# Patient Record
Sex: Male | Born: 1935
Health system: Southern US, Community
[De-identification: ages and names within clinical notes are randomized; demographics above are authoritative.]

---

## 2005-11-13 ENCOUNTER — Emergency Department: Payer: Self-pay | Admitting: Emergency Medicine

## 2006-09-26 ENCOUNTER — Ambulatory Visit: Payer: Self-pay | Admitting: Ophthalmology

## 2006-10-02 ENCOUNTER — Ambulatory Visit: Payer: Self-pay | Admitting: Ophthalmology

## 2006-11-01 IMAGING — CR DG CHEST 2V
1 series · 2 of 2 positions shown · non-contrast
Comparison: none

REASON FOR EXAM: WEAKNESS   RM 16
COMMENTS:

[Series 1: view not recorded · 0.17mm/px · 2 of 2 slices shown]
[im 1/2]
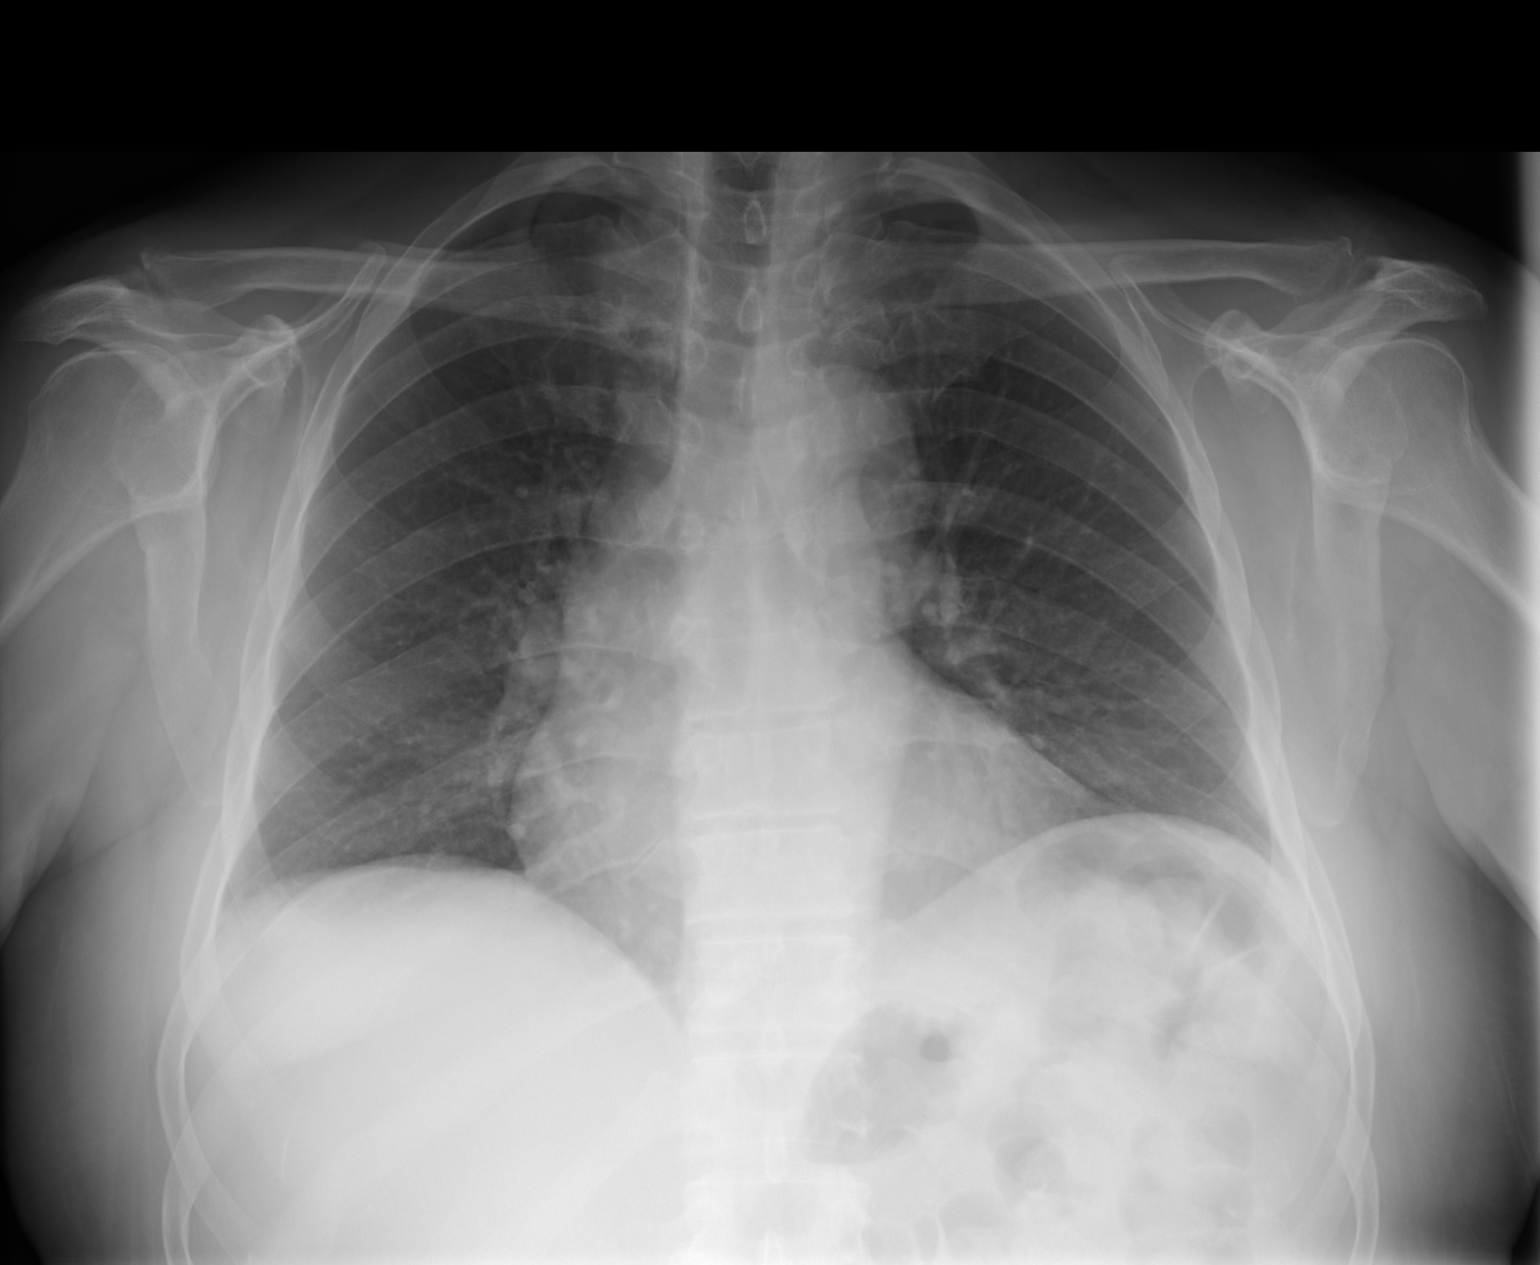
[im 2/2]
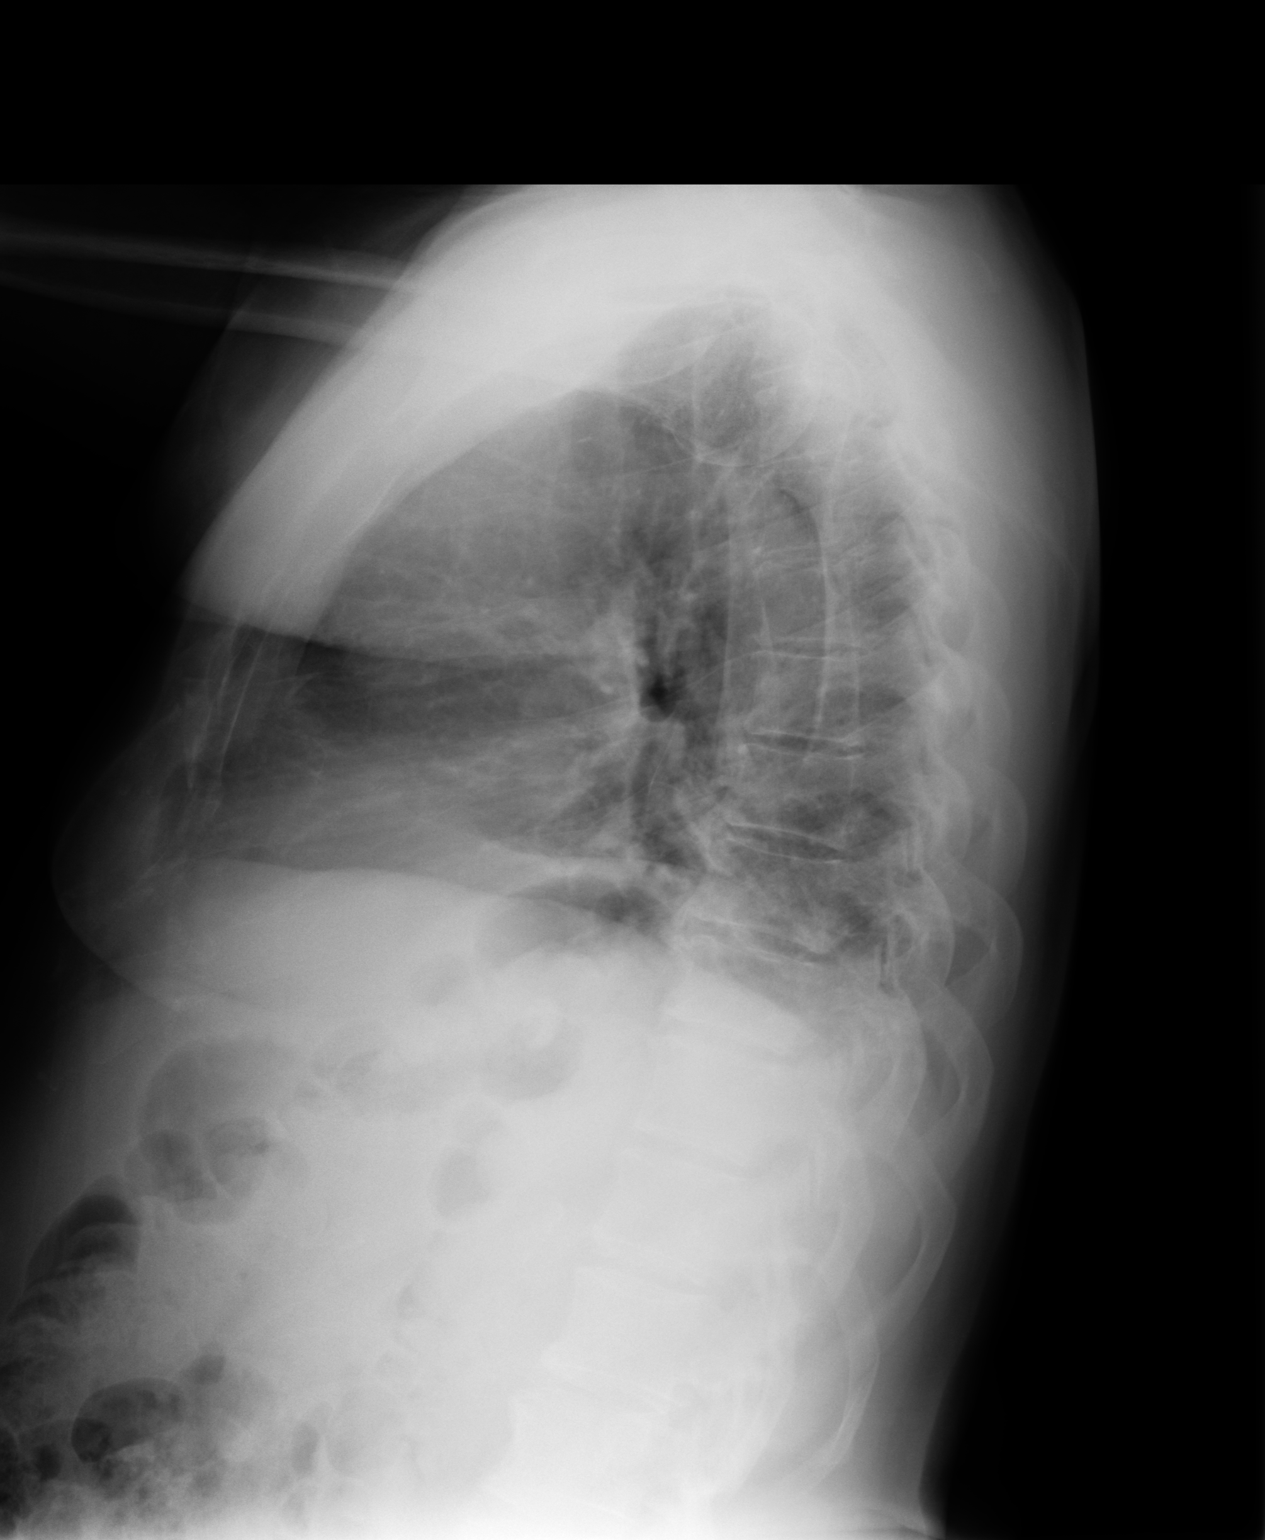

[2 of 2 positions shown; findings below may reference images not displayed]

PROCEDURE:     DXR - DXR CHEST PA (OR AP) AND LATERAL  - November 13, 2005 [DATE]

RESULT:     The lungs are adequately inflated on the frontal view. On the
lateral film there is evidence of some hypoinflation and possible minimal
basilar atelectasis.  The heart is not enlarged and the pulmonary
vascularity is not engorged.  There is mild tortuosity of the descending
thoracic aorta.
IMPRESSION: 1)I do not see objective evidence of acute cardiopulmonary disease.

## 2006-12-11 ENCOUNTER — Ambulatory Visit: Payer: Self-pay | Admitting: Ophthalmology

## 2006-12-18 ENCOUNTER — Ambulatory Visit: Payer: Self-pay | Admitting: Ophthalmology

## 2007-05-15 ENCOUNTER — Ambulatory Visit: Payer: Self-pay | Admitting: Gastroenterology

## 2011-07-03 ENCOUNTER — Ambulatory Visit: Payer: Self-pay | Admitting: Gastroenterology

## 2011-08-03 ENCOUNTER — Ambulatory Visit: Payer: Self-pay | Admitting: Gastroenterology

## 2011-08-07 LAB — PATHOLOGY REPORT

## 2011-09-11 ENCOUNTER — Ambulatory Visit: Payer: Self-pay | Admitting: Gastroenterology

## 2013-04-06 ENCOUNTER — Ambulatory Visit: Payer: Self-pay | Admitting: General Practice

## 2013-04-06 LAB — PROTIME-INR
INR: 1
Prothrombin Time: 13.7 secs (ref 11.5–14.7)

## 2013-04-06 LAB — CBC
HCT: 41 % (ref 40.0–52.0)
HGB: 14 g/dL (ref 13.0–18.0)
MCHC: 34.1 g/dL (ref 32.0–36.0)
MCV: 88 fL (ref 80–100)
Platelet: 199 10*3/uL (ref 150–440)
RBC: 4.65 10*6/uL (ref 4.40–5.90)
RDW: 14.4 % (ref 11.5–14.5)
WBC: 7 10*3/uL (ref 3.8–10.6)

## 2013-04-06 LAB — URINALYSIS, COMPLETE
Bacteria: NONE SEEN
Bilirubin,UR: NEGATIVE
Blood: NEGATIVE
Ketone: NEGATIVE
Leukocyte Esterase: NEGATIVE
Nitrite: NEGATIVE
Protein: NEGATIVE
RBC,UR: 1 /HPF (ref 0–5)
Specific Gravity: 1.015 (ref 1.003–1.030)
Squamous Epithelial: NONE SEEN
WBC UR: 1 /HPF (ref 0–5)

## 2013-04-06 LAB — APTT: Activated PTT: 33.9 secs (ref 23.6–35.9)

## 2013-04-06 LAB — SEDIMENTATION RATE: Erythrocyte Sed Rate: 9 mm/hr (ref 0–20)

## 2013-04-06 LAB — BASIC METABOLIC PANEL
Co2: 28 mmol/L (ref 21–32)
Creatinine: 1.38 mg/dL — ABNORMAL HIGH (ref 0.60–1.30)
EGFR (Non-African Amer.): 48 — ABNORMAL LOW
Glucose: 94 mg/dL (ref 65–99)
Potassium: 4.4 mmol/L (ref 3.5–5.1)
Sodium: 138 mmol/L (ref 136–145)

## 2013-04-06 LAB — MRSA PCR SCREENING

## 2013-04-07 LAB — URINE CULTURE

## 2013-04-20 ENCOUNTER — Inpatient Hospital Stay: Payer: Self-pay | Admitting: General Practice

## 2013-04-21 LAB — BASIC METABOLIC PANEL
Co2: 26 mmol/L (ref 21–32)
Creatinine: 1.09 mg/dL (ref 0.60–1.30)
Glucose: 99 mg/dL (ref 65–99)
Osmolality: 276 (ref 275–301)
Potassium: 4.3 mmol/L (ref 3.5–5.1)
Sodium: 137 mmol/L (ref 136–145)

## 2013-04-21 LAB — HEMOGLOBIN: HGB: 12 g/dL — ABNORMAL LOW (ref 13.0–18.0)

## 2013-04-22 LAB — BASIC METABOLIC PANEL
Anion Gap: 5 — ABNORMAL LOW (ref 7–16)
BUN: 14 mg/dL (ref 7–18)
Chloride: 107 mmol/L (ref 98–107)
Co2: 25 mmol/L (ref 21–32)
EGFR (African American): >60
EGFR (Non-African Amer.): >60
Glucose: 107 mg/dL — ABNORMAL HIGH (ref 65–99)
Osmolality: 275 (ref 275–301)

## 2013-04-23 ENCOUNTER — Encounter: Payer: Self-pay | Admitting: Internal Medicine

## 2014-04-08 IMAGING — CR DG KNEE 1-2V*R*
1 series · 2 of 2 positions shown · non-contrast
Comparison: none

REASON FOR EXAM: postop
COMMENTS:   Bedside (portable):Y

[Series 1: ap · 0.17mm/px · 2 of 2 slices shown]
[im 1/2]
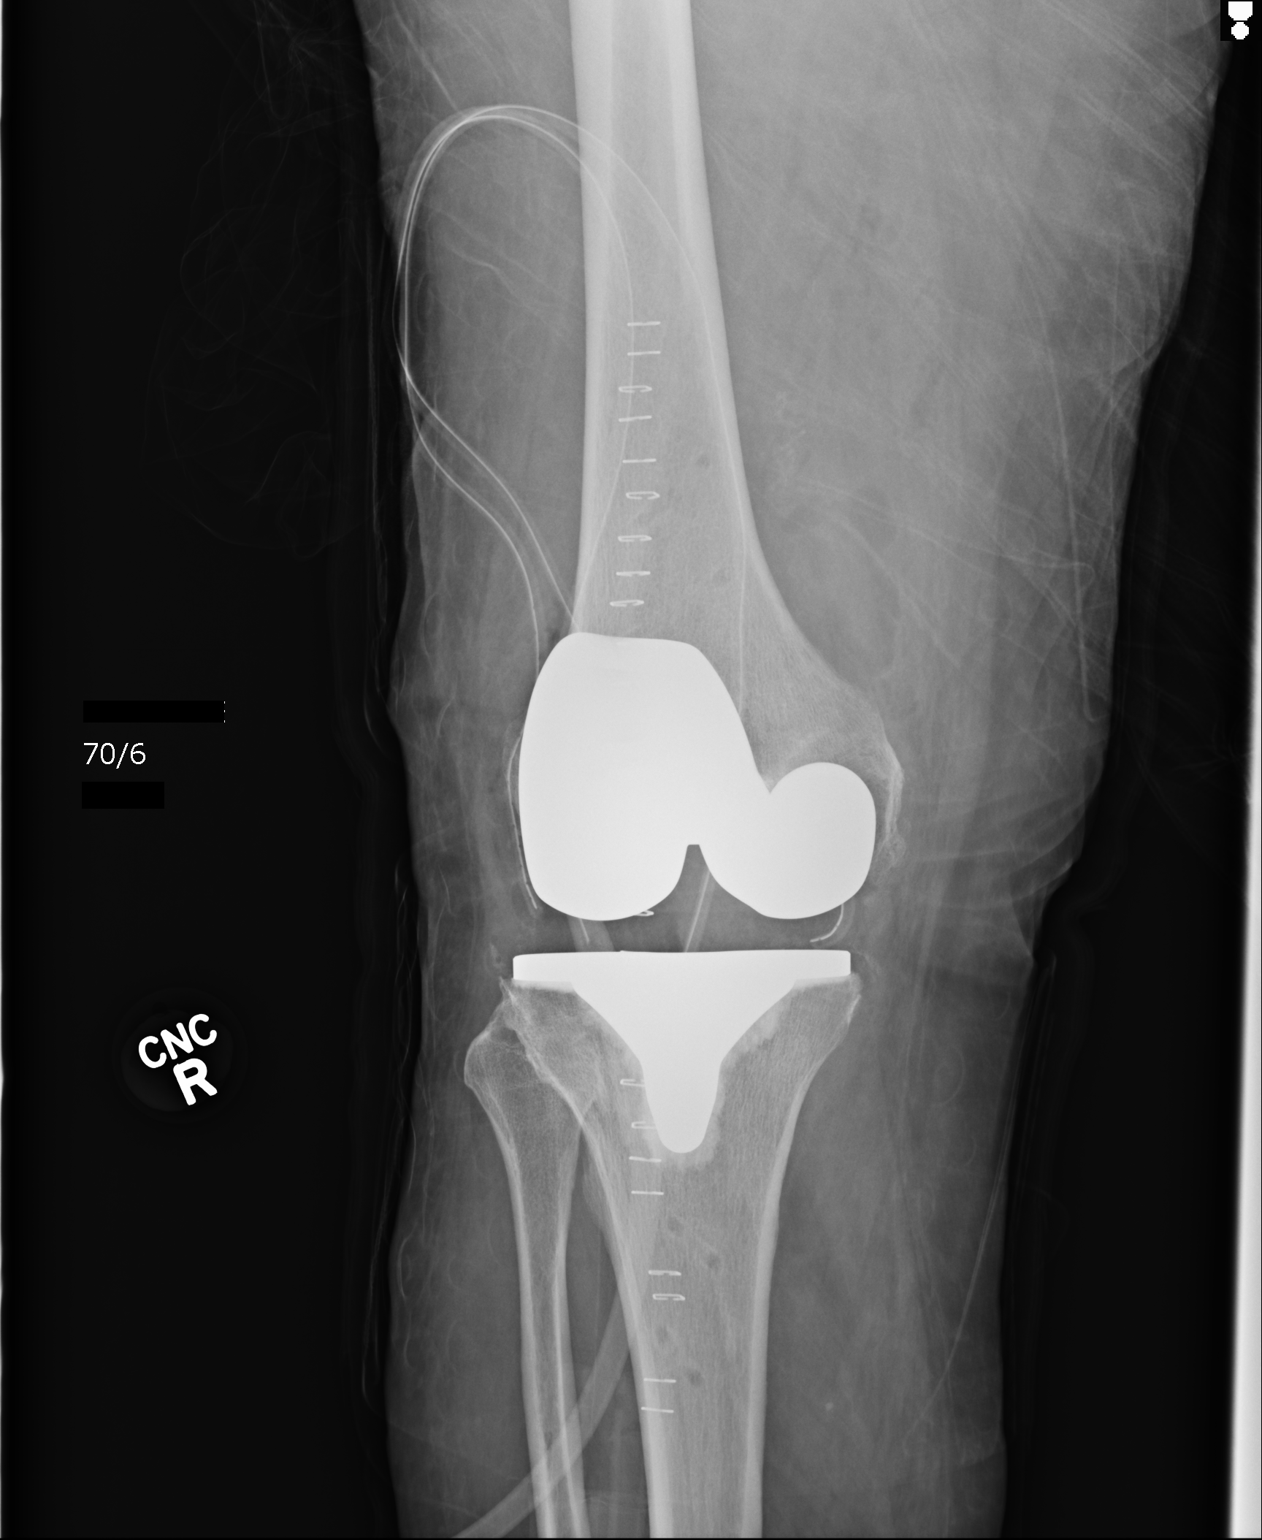
[im 2/2]
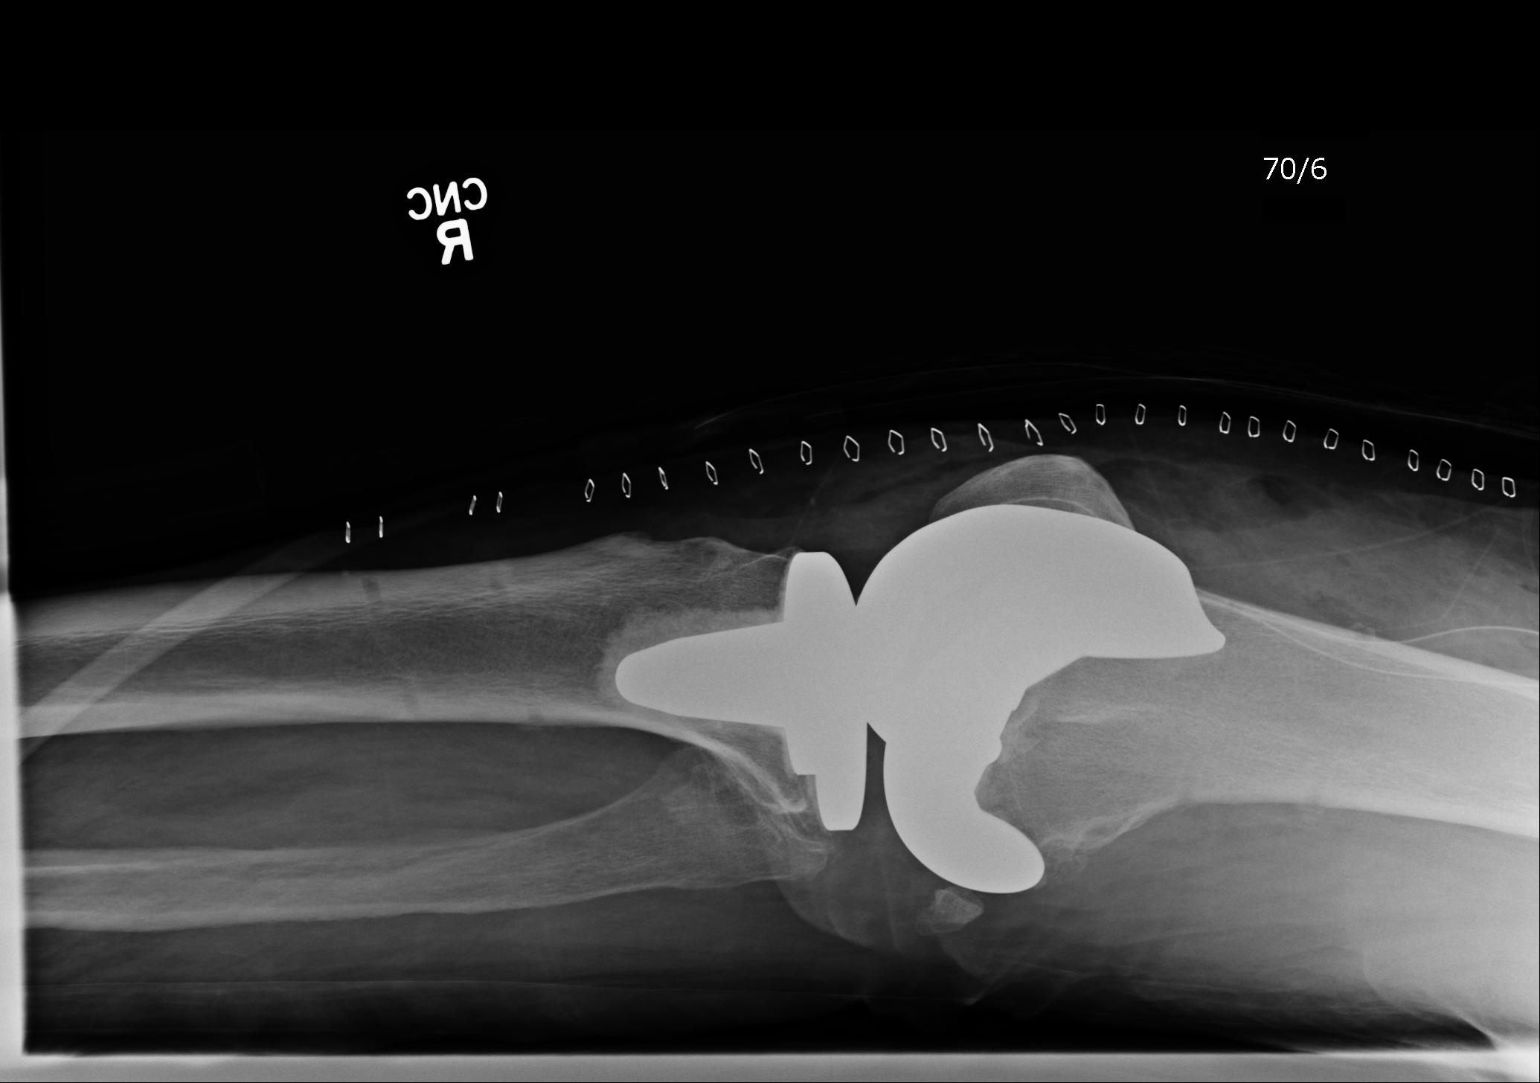

[2 of 2 positions shown; findings below may reference images not displayed]

PROCEDURE:     DXR - DXR KNEE RIGHT AP AND LATERAL  - April 20, 2013  [DATE]

RESULT:     AP and lateral portable views in the recovery room for the
patient to have undergone right total knee joint prosthesis placement.
Surgical drain lines and skin staples are present. Positioning of the
prosthetic components is good radiographically.
IMPRESSION: The patient has undergone right total knee joint prosthesis
placement. Further interpretation is deferred to Dr. Xiao.

[REDACTED]

## 2014-11-12 NOTE — Discharge Summary (Signed)
PATIENT NAME:  Todd Boone, Todd Boone MR#:  [id number] DATE OF BIRTH:  [birth year]  DATE OF ADMISSION:  04/20/2013 DATE OF DISCHARGE:  04/23/2013   DICTATING FOR: Fayrene FearingJames P. Angie FavaHooten Jr., MD  ADMITTING DIAGNOSIS: Degenerative arthrosis of right knee.   DISCHARGE DIAGNOSIS: Degenerative arthrosis of right knee.   HISTORY OF PRESENT ILLNESS: The patient is a 81 year old who has [facility] progression of right knee pain. He had a long history of progressive bilateral knee pain, with the right being more symptomatic than the left. He had reported that his pain had become severe with weight-bearing activities, and he has also noted some swelling of the knee. He had also noticed progressive decrease in his range of motion. He had not seen any significant improvement in his condition despite the use of intra-articular cortisone injections, Synvisc injections as well as ambulatory aids. The right knee pain had progressed to the point that it was significantly interfering with his activities of daily living. [facility] showed very little medial cartilage space with relative varus alignment. Osteophyte as well as subchondral sclerosis were noted. After discussion of the risks and benefits of surgical intervention, the patient expressed his understanding of the risks and benefits and agreed for plans for surgical intervention.   PROCEDURE: Right total knee arthroplasty using computer-assisted navigation.   ANESTHESIA: Spinal.   SOFT TISSUE RELEASE: Anterior cruciate ligament, posterior cruciate ligament, deep medial collateral ligaments as well as the patellofemoral ligament.   IMPLANTS UTILIZED: DePuy PFC Sigma size 4 posterior stabilized femoral component (cemented), size 4 MBT tibial component (cemented), 38 mm 3-pegged oval dome patella (cemented), and a 10 mm stabilized rotating platform polyethylene insert. Gentamicin cement was utilized.   HOSPITAL  COURSE: The patient tolerated the procedure very well. He had no complications. He was then taken to the PACU, where he was stabilized and then transferred to the orthopedic floor. He began receiving anticoagulation therapy of Lovenox 30 mg subcutaneous q.12 hours per anesthesia and pharmacy protocol. He was fitted with TED stockings bilaterally. These were allowed to be removed 1 hour per 8-hour shift. The right one was applied on day 2 following removal of the Hemovac and dressing change. He was also fitted with the AVI compression foot pumps set at 80 mmHg. His calves have been nontender. There has been no evidence of any DVTs. Negative Homans sign. Heels were elevated off the bed using rolled towels. He did have a tendency to lie with his leg externally rotated during the entire hospital course.   The patient has denied any chest pains or any shortness of breath. Vital signs have been stable. He has been afebrile. Hemodynamically, he was stable, and no transfusions were given other than the Autovac transfusion given the first 6 hours postoperatively.   Physical therapy was initiated on day 1 for gait training and transfers. He has done very well. He has had no complications. He has had good ambulation and range of motion. Occupational therapy was also initiated on day 1 for ADLs and assistive devices.   The patient's IV, Foley and Hemovac were discontinued on day 2 along with a dressing change. The wound was free of any drainage or any signs of infection. Polar Care was reapplied to the surgical leg, maintaining a temperature of 40 to 50 degrees Fahrenheit.   DISPOSITION: The patient is being discharged to a skilled nursing facility in improved stable condition.   DISCHARGE INSTRUCTIONS:  1. He may weight bear  as tolerated. Continue using a walker until cleared by physical therapy to go to a quad cane.  2. Elevate the heels off the bed.  3. Continue with TED stockings. These are to be worn during  the day but may be removed at night.  4. Incentive spirometer q.1 hour while awake. Encourage cough and deep breathing q.2 hours while awake.  5. Polar Care to the surgical leg, maintaining a temperature of 40 to 50 degrees Fahrenheit. 6. Change dressing as needed.  7. He has a followup appointment in [facility] on October 14th at 9:45. If he has any complications, he is to be seen sooner.   DRUG ALLERGIES: CELEBREX AND PENICILLIN.   MEDICATIONS:  1. Senokot-S 1 tablet b.i.d.  2. Avodart 0.5 mg daily.  3. Fenofibrate micronized 160 mg daily.  4. Hydrochlorothiazide 12.5/20 mg daily. 5. Prilosec 40 mg at 6:00 a.m.  6. Zocor 40 mg at bedtime.  7. Hytrin 10 mg at bedtime. 8. Lovenox 30 mg subcutaneous q.12 hours for 14 days, then discontinue and begin taking one 81 mg enteric-coated aspirin.  9. Tylenol ES 500 to 1000 mg q.4 hours p.r.n. for temperatures of 100.4 or greater.  10. Milk of Magnesia 30 mL q.6 hours p.r.n.  11. Dulcolax suppositories 10 mg rectally p.r.n. if no results with Milk of Magnesia or Dulcolax. 12. Roxicodone 5 to 10 mg q.4-6 hours p.r.n. for pain.  13. Tramadol 50 to 100 mg q.4-6 hours p.r.n. for pain.  14. Enema soapsuds if no results with Milk of Magnesia or Dulcolax.   PAST MEDICAL HISTORY:  1. Arthritis.  2. Chickenpox.  3. Hyperlipidemia.  4. Hypertension.  5. GERD.  ____________________________ Van Clines, PA [email or web address] D: 04/23/2013 07:15:14 ET T: 04/23/2013 07:46:08 ET JOB#: [id number]  cc: Van Clines, PA, <Dictator> JON WOLFE PA ELECTRONICALLY SIGNED 04/28/2013 21:48

## 2014-11-12 NOTE — Op Note (Signed)
PATIENT NAME:  Todd Boone, Todd Boone MR#:  [id number] DATE OF BIRTH:  May 01, 1931  DATE OF PROCEDURE:  04/20/2013  PREOPERATIVE DIAGNOSIS: Degenerative arthrosis of the right knee.   POSTOPERATIVE DIAGNOSIS: Degenerative arthrosis of the right knee.   PROCEDURE PERFORMED: Right total knee arthroplasty using computer-assisted navigation.   SURGEON: Illene Labrador. Hooten, M.D.   ASSISTANT: Van Clines, PA (required to maintain retraction throughout the procedure).   ANESTHESIA: Spinal.   ESTIMATED BLOOD LOSS: 100 mL.   FLUIDS REPLACED: 1000 mL of crystalloid.   TOURNIQUET TIME: 85 minutes.   DRAINS: Two medium drains to reinfusion system.   SOFT TISSUE RELEASES: Anterior cruciate ligament, posterior cruciate ligament, deep medial collateral ligament, patellofemoral ligament.   IMPLANTS UTILIZED: DePuy PFC Sigma size 4 posterior stabilized femoral component (cemented), size 4 MBT tibial component (cemented), 38 mm 3-peg oval dome patella (cemented), and a 10 mm stabilized rotating platform polyethylene insert. Gentamicin cement was utilized.   INDICATIONS FOR SURGERY: The patient is a 81 year old male who has been seen for complaints of progressive right knee pain. X-rays demonstrated severe degenerative changes in tricompartmental fashion. After discussion of the risks and benefits of surgical intervention, the patient expressed understanding of the risks and benefits and agreed with plans for surgical intervention.   PROCEDURE IN DETAIL: The patient was brought into the operating room and after adequate spinal anesthesia was achieved, a tourniquet was placed on the patient's upper right thigh. The patient's right knee and leg were cleaned and prepped with alcohol and DuraPrep and draped in the usual sterile fashion. A "timeout" was performed as per usual protocol. The right lower extremity was exsanguinated using an Esmarch, and the tourniquet was inflated to 300 mmHg. An anterior longitudinal incision  was made, followed by a standard mid vastus approach. A small effusion was evacuated. The deep fibers of the medial collateral ligament were elevated in a subperiosteal fashion off the medial flare of the tibia so as to maintain a continuous soft tissue sleeve. The patella was subluxed laterally and the patellofemoral ligament was incised. Inspection of the knee demonstrated significant degenerative changes in all 3 compartments. Prominent osteophytes were debrided using a rongeur. Anterior and posterior cruciate ligaments were excised. Two 4.0 mm Schanz pins were inserted into the femur and into the tibia for attachment of the array of trackers used for computer-assisted navigation. Hip center was identified using circumduction technique. Distal landmarks were mapped using the computer. The distal femur and proximal tibia were mapped using the computer. Distal femoral cutting guide was positioned using computer-assisted navigation so as to achieve a 5 degree distal valgus cut. Cut was performed and verified using the computer. Distal femur was sized and it was felt that a size 4 femoral component was appropriate. A size 4 cutting guide was positioned and the anterior cut was performed and verified using the computer. This was followed by completion of the posterior and chamfer cuts. Femoral cutting guide for the central box was then positioned and the central box cut was performed. Attention was then directed to the proximal tibia. Medial and lateral menisci were excised. The extramedullary tibial cutting guide was positioned using computer-assisted navigation so as to achieve 0 degree varus-valgus alignment and 0 degree posterior slope. Cut was performed and verified using the computer. The proximal tibia was sized and it was felt that a size 4 tibial tray was appropriate. Tibial and femoral trials were inserted, followed by insertion of a 10 mm polyethylene insert. Excellent medial and lateral soft tissue  balancing was appreciated both in full extension and in flexion. Finally, the patella was cut and prepared so as to accommodate a 38 mm 3-peg oval dome patella. Patellar trial was placed and the knee was placed through a range of motion, with excellent patellar tracking appreciated. The femoral trial was removed after debridement of posterior osteophytes. Central post hole for the tibial component was reamed, followed by insertion of a keel punch. Tibial tray was then removed. The cut surfaces of bone were irrigated with copious amounts of normal saline with antibiotic solution using pulsatile lavage and then suctioned dry. Polymethylmethacrylate cement with gentamicin was prepared in the usual fashion using a vacuum mixer. Cement was applied to the cut surface of the proximal tibia as well as along the undersurface of a size 4 MBT tibial component. The tibial component was positioned and impacted into place. Excess cement was removed using Personal assistantreer elevators. Cement was then applied to the cut surface of the femur as well as along the posterior flanges of a size 4 posterior stabilized femoral component. Femoral component was positioned and impacted into place. Excess cement was removed using Personal assistantreer elevators. A 10 mm polyethylene trial was inserted and the knee was brought into full extension with steady axial compression applied. Finally, cement was applied to the backside of a 38 mm 3- peg oval dome patella, and the patellar component was positioned and patellar clamp applied. Excess cement was removed using Personal assistantreer elevators.   After adequate curing of cement, the tourniquet was deflated after a total tourniquet time of 85 minutes. Hemostasis was achieved using electrocautery. The knee was irrigated with copious amounts of normal saline with antibiotic solution using pulsatile lavage and then suctioned dry. The knee was inspected for any residual cement debris. 20 mL of 1.3% Exparel in 40 mL of normal saline was  injected along the posterior recesses, along the medial and lateral gutters and along the arthrotomy site. A 10 mm stabilized rotating platform polyethylene insert was inserted and the knee was placed through a range of motion, with excellent patellar tracking appreciated and excellent medial and lateral soft tissue balancing noted both in full extension and in flexion. Two medium drains were placed in the wound bed and brought out through a separate stab incision to be attached to a reinfusion system. The medial parapatellar portion of the incision was reapproximated using interrupted sutures of #1 Vicryl. 30 mL of 0.25% Marcaine with epinephrine was injected along the subcutaneous tissue. The subcutaneous tissue was then approximated in layers using first #0 Vicryl followed by 2-0 Vicryl. Skin was closed with skin staples. A sterile dressing was applied.   The patient tolerated the procedure well. He was transported to the recovery room in stable condition.    ____________________________ Illene LabradorJames P. Angie FavaHooten Jr., MD [email or web address] D: 04/20/2013 17:29:03 ET T: 04/20/2013 19:53:40 ET JOB#: [id number]  cc: Fayrene FearingJames P. Angie FavaHooten Jr., MD, <Dictator> JAMES P Angie FavaHOOTEN JR MD ELECTRONICALLY SIGNED 04/25/2013 9:40

## 2021-09-20 DEATH — deceased
